# Patient Record
Sex: Male | Born: 2005 | Race: White | Hispanic: Yes | Marital: Single | Smoking: Never smoker
Health system: Southern US, Community
[De-identification: ages and names within clinical notes are randomized; demographics above are authoritative.]

## PROBLEM LIST (undated history)

## (undated) DIAGNOSIS — Z789 Other specified health status: Secondary | ICD-10-CM

---
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
## 2023-10-06 ENCOUNTER — Encounter (HOSPITAL_BASED_OUTPATIENT_CLINIC_OR_DEPARTMENT_OTHER): Payer: Self-pay

## 2023-10-06 ENCOUNTER — Other Ambulatory Visit: Payer: Self-pay

## 2023-10-06 ENCOUNTER — Emergency Department (HOSPITAL_BASED_OUTPATIENT_CLINIC_OR_DEPARTMENT_OTHER)
Admission: EM | Admit: 2023-10-06 | Discharge: 2023-10-06 | Disposition: A | Discharge status: Home / Self Care | Payer: Worker's Compensation | Attending: Emergency Medicine | Admitting: Emergency Medicine

## 2023-10-06 ENCOUNTER — Emergency Department (HOSPITAL_BASED_OUTPATIENT_CLINIC_OR_DEPARTMENT_OTHER): Payer: Worker's Compensation | Admitting: Radiology

## 2023-10-06 DIAGNOSIS — S41111A Laceration without foreign body of right upper arm, initial encounter: Secondary | ICD-10-CM

## 2023-10-06 DIAGNOSIS — Z23 Encounter for immunization: Secondary | ICD-10-CM | POA: Insufficient documentation

## 2023-10-06 DIAGNOSIS — W208XXA Other cause of strike by thrown, projected or falling object, initial encounter: Secondary | ICD-10-CM | POA: Diagnosis not present

## 2023-10-06 MED ORDER — TETANUS-DIPHTH-ACELL PERTUSSIS 5-2.5-18.5 LF-MCG/0.5 IM SUSY
0.5000 mL | PREFILLED_SYRINGE | Freq: Once | INTRAMUSCULAR | Status: AC
  Administered 2023-10-06: 0.5 mL via INTRAMUSCULAR
  Filled 2023-10-06: qty 0.5

## 2023-10-06 MED ORDER — LIDOCAINE-EPINEPHRINE (PF) 2 %-1:200000 IJ SOLN
10.0000 mL | Freq: Once | INTRAMUSCULAR | Status: AC
  Administered 2023-10-06: 10 mL
  Filled 2023-10-06: qty 20
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
## 2023-10-06 NOTE — ED Notes (Signed)
Pt's wound irrigated with sterile water. Non-adhesive dressing applied.

## 2023-10-06 NOTE — Discharge Instructions (Addendum)
Today you were treated for a laceration of the right upper arm.  Please follow-up with primary care in 7 to 10 days to have the sutures evaluated for removal.  Please do not soak your arm in water of any kind.  Please read attached instructions for laceration care.  Thank you for letting us treat you today. After performing a physical exam and reviewing imaging, I feel you are safe to go home. Please follow up with your PCP in the next several days and provide them with your records from this visit. Return to the Emergency Room if pain becomes severe or symptoms worsen.

## 2023-10-19 ENCOUNTER — Other Ambulatory Visit: Payer: Self-pay

## 2023-10-19 ENCOUNTER — Ambulatory Visit (HOSPITAL_COMMUNITY)
Admission: EM | Admit: 2023-10-19 | Discharge: 2023-10-19 | Disposition: A | Discharge status: Home / Self Care | Attending: Family Medicine | Admitting: Family Medicine

## 2023-10-19 ENCOUNTER — Ambulatory Visit (HOSPITAL_COMMUNITY): Payer: Medicaid Other

## 2023-10-19 ENCOUNTER — Encounter (HOSPITAL_COMMUNITY): Payer: Self-pay | Admitting: Emergency Medicine

## 2023-10-19 DIAGNOSIS — S41111A Laceration without foreign body of right upper arm, initial encounter: Secondary | ICD-10-CM | POA: Diagnosis not present

## 2023-10-19 NOTE — ED Triage Notes (Signed)
Pt requesting to have sutures evaluated and removed and requesting x ray of his right arm. Pt states his job is requesting xray done.

## 2023-10-20 NOTE — ED Provider Notes (Addendum)
  [facility]   [id number] 10/19/23 Arrival Time: 1640  ASSESSMENT & PLAN:  1. Arm laceration, right, initial encounter    Sutures removed by CMA without complication.  This is a worker's comp visit.  Follow-up Information     Schedule an appointment as soon as possible for a visit  with [facility].   Why: [address] [place], [place] [address]  [phone] Contact information: [address] [place] [address]               Tylenol if needed.   Reviewed expectations re: course of current medical issues. Questions answered. Outlined signs and symptoms indicating need for more acute intervention. Patient verbalized understanding. After Visit Summary given.   SUBJECTIVE:  Aaron Rice is a 17 y.o. male who presents with a laceration of R upper arm; seen in ED previously for eval and sutures. Here for suture removal. Still some tenderness around wound.  OBJECTIVE:  Vitals:   10/19/23 1822  BP: 117/77  Pulse: 63  Resp: 16  SpO2: 98%     General appearance: alert; no distress Skin: healed laceration of RUE; sutures intact; no signs of infection Psychological: alert and cooperative; normal mood and affect  No results found for this or any previous visit.  Labs Reviewed - No data to display  No results found.  No Known Allergies  Past Medical History:  Diagnosis Date   Medical history non-contributory    Social History   Socioeconomic History   Marital status: Single    Spouse name: Not on file   Number of children: Not on file   Years of education: Not on file   Highest education level: Not on file  Occupational History   Not on file  Tobacco Use   Smoking status: Never   Smokeless tobacco: Never  Vaping Use   Vaping status: Never Used  Substance and Sexual Activity   Alcohol use: No   Drug use: Never   Sexual activity: Never  Other Topics Concern    Not on file  Social History Narrative   Pt lives at home with mom, dad, brother, and sister.  No pets.  No smoking.   Social Determinants of Health   Financial Resource Strain: Not on File (05/29/2022)   Received from [employer]    Financial Resource Strain: 0  Food Insecurity: Not on File (09/02/2023)   Received from [facility] Insecurity    Food: 0  Transportation Needs: Not on File (05/29/2022)   Received from [facility] Needs    Transportation: 0  Physical Activity: Not on File (05/29/2022)   Received from [facility]   Physical Activity    Physical Activity: 0  Stress: Not on File (05/29/2022)   Received from [facility]   Stress    Stress: 0  Social Connections: Not on File (09/03/2023)   Received from [facility]   Social Connections    Connectedness: 0          Mardella Layman, MD 10/20/23 1422    Mardella Layman, MD 10/20/23 1423
